# Patient Record
Sex: Female | Born: 1990 | Race: White | Hispanic: No | Marital: Single | Smoking: Never smoker
Health system: Southern US, Community
[De-identification: ages and names within clinical notes are randomized; demographics above are authoritative.]

## PROBLEM LIST (undated history)

## (undated) ENCOUNTER — Emergency Department (HOSPITAL_COMMUNITY): Discharge status: Absent without leave | Payer: Self-pay

## (undated) DIAGNOSIS — J020 Streptococcal pharyngitis: Secondary | ICD-10-CM

---
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
## 2012-07-12 ENCOUNTER — Other Ambulatory Visit: Payer: Self-pay | Admitting: Obstetrics and Gynecology

## 2012-07-12 MED ORDER — NORETHIN ACE-ETH ESTRAD-FE 1-20 MG-MCG(24) PO CHEW: 1.0000 | CHEWABLE_TABLET | Freq: Every day | ORAL | Status: DC

## 2012-07-12 NOTE — Telephone Encounter (Signed)
NICCOLE/BC REQ

## 2012-08-07 ENCOUNTER — Encounter: Payer: Self-pay | Admitting: Obstetrics and Gynecology

## 2012-10-05 ENCOUNTER — Telehealth: Payer: Self-pay

## 2012-10-05 NOTE — Telephone Encounter (Signed)
Spoke with pt rgd concerns informed pt rx named changed new rx sent to pharm in September can call pharmacy for rx pt voice understanding

## 2013-11-27 ENCOUNTER — Ambulatory Visit (INDEPENDENT_AMBULATORY_CARE_PROVIDER_SITE_OTHER): Payer: Self-pay | Admitting: Emergency Medicine

## 2013-11-27 VITALS — BP 106/66 | HR 81 | Temp 97.8°F | Resp 16 | Ht <= 58 in | Wt 101.0 lb

## 2013-11-27 DIAGNOSIS — M545 Low back pain, unspecified: Secondary | ICD-10-CM

## 2013-11-27 DIAGNOSIS — N3000 Acute cystitis without hematuria: Secondary | ICD-10-CM

## 2013-11-27 LAB — POCT UA - MICROSCOPIC ONLY
CASTS, UR, LPF, POC: NEGATIVE
Yeast, UA: NEGATIVE

## 2013-11-27 LAB — POCT URINALYSIS DIPSTICK
BILIRUBIN UA: NEGATIVE
Glucose, UA: NEGATIVE
KETONES UA: NEGATIVE
Nitrite, UA: POSITIVE
PH UA: 7
Protein, UA: 30
Spec Grav, UA: 1.02
Urobilinogen, UA: 0.2

## 2013-11-27 LAB — POCT URINE PREGNANCY: PREG TEST UR: NEGATIVE

## 2013-11-27 MED ORDER — PHENAZOPYRIDINE HCL 200 MG PO TABS: 200.0000 mg | ORAL_TABLET | Freq: Three times a day (TID) | ORAL | Status: DC | PRN

## 2013-11-27 MED ORDER — SULFAMETHOXAZOLE-TMP DS 800-160 MG PO TABS: 1.0000 | ORAL_TABLET | Freq: Two times a day (BID) | ORAL | Status: DC

## 2013-11-27 NOTE — Progress Notes (Signed)
[facility] [address], [place] [address] [phone]  Date:  11/27/2013   Name:  Sara Sanchez   DOB:  12-11-1990   MRN:  [id number]  PCP:  No primary provider on file.    Chief Complaint: Flank Pain and Emesis   History of Present Illness:  Sara Sanchez is a 23 y.o. very pleasant female patient who presents with the following:  Says she has right flank pain cramp like started on Sunday.  She thought the pain was dysmenorrhea and took OTC meds.  No relief of pain. Developed vomiting yesterday that has improved but not eating, only taking liquids.  No fever or chills.  No vaginal discharge or vomiting.  Says last menses 12/24 and so far this month's has not begun.  No dysuria, urgency or frequency, vaginal discharge or dyspareunia.  No improvement with over the counter medications or other home remedies. Denies other complaint or health concern today.   There are no active problems to display for this patient.   No past medical history on file.  No past surgical history on file.  History  Substance Use Topics  . Smoking status: Never Smoker   . Smokeless tobacco: Not on file  . Alcohol Use:     No family history on file.  No Known Allergies  Medication list has been reviewed and updated.  Current Outpatient Prescriptions on File Prior to Visit  Medication Sig Dispense Refill  . Norethin Ace-Eth Estrad-FE (MINASTRIN 24 FE) 1-20 MG-MCG(24) CHEW Chew 1 tablet by mouth daily.  28 tablet  5   No current facility-administered medications on file prior to visit.    Review of Systems:  As per HPI, otherwise negative.    Physical Examination: Filed Vitals:   11/27/13 1646  BP: 106/66  Pulse: 81  Temp: 97.8 F (36.6 C)  Resp: 16   Filed Vitals:   11/27/13 1646  Height: 4' 9.5" (1.461 m)  Weight: 101 lb (45.813 kg)   Body mass index is 21.46 kg/(m^2). Ideal Body Weight: Weight in (lb) to have BMI = 25: 117.3  GEN: WDWN, NAD,  Non-toxic, A & O x 3 HEENT: Atraumatic, Normocephalic. Neck supple. No masses, No LAD. Ears and Nose: No external deformity. CV: RRR, No M/G/R. No JVD. No thrill. No extra heart sounds. PULM: CTA B, no wheezes, crackles, rhonchi. No retractions. No resp. distress. No accessory muscle use. ABD: S, NT, ND, +BS. No rebound. No HSM. EXTR: No c/c/e NEURO Normal gait.  PSYCH: Normally interactive. Conversant. Not depressed or anxious appearing.  Calm demeanor.    Assessment and Plan: Acute cystitis Septra Pyridium  Signed,  Phillips Odor, MD   Results for orders placed in visit on 11/27/13  POCT URINE PREGNANCY      Result Value Range   Preg Test, Ur Negative    POCT URINALYSIS DIPSTICK      Result Value Range   Color, UA yellow     Clarity, UA cloudy     Glucose, UA neg     Bilirubin, UA neg     Ketones, UA neg     Spec Grav, UA 1.020     Blood, UA small     pH, UA 7.0     Protein, UA 30     Urobilinogen, UA 0.2     Nitrite, UA positive     Leukocytes, UA moderate (2+)    POCT UA - MICROSCOPIC ONLY  Result Value Range   WBC, Ur, HPF, POC 15-20     RBC, urine, microscopic 0-2     Bacteria, U Microscopic 1+     Mucus, UA small     Epithelial cells, urine per micros 0-6     Crystals, Ur, HPF, POC amorphous urate crystals     Casts, Ur, LPF, POC neg     Yeast, UA neg

## 2013-11-27 NOTE — Patient Instructions (Signed)
Urinary Tract Infection  Urinary tract infections (UTIs) can develop anywhere along your urinary tract. Your urinary tract is your body's drainage system for removing wastes and extra water. Your urinary tract includes two kidneys, two ureters, a bladder, and a urethra. Your kidneys are a pair of bean-shaped organs. Each kidney is about the size of your fist. They are located below your ribs, one on each side of your spine.  CAUSES  Infections are caused by microbes, which are microscopic organisms, including fungi, viruses, and bacteria. These organisms are so small that they can only be seen through a microscope. Bacteria are the microbes that most commonly cause UTIs.  SYMPTOMS   Symptoms of UTIs may vary by age and gender of the patient and by the location of the infection. Symptoms in young women typically include a frequent and intense urge to urinate and a painful, burning feeling in the bladder or urethra during urination. Older women and men are more likely to be tired, shaky, and weak and have muscle aches and abdominal pain. A fever may mean the infection is in your kidneys. Other symptoms of a kidney infection include pain in your back or sides below the ribs, nausea, and vomiting.  DIAGNOSIS  To diagnose a UTI, your caregiver will ask you about your symptoms. Your caregiver also will ask to provide a urine sample. The urine sample will be tested for bacteria and white blood cells. White blood cells are made by your body to help fight infection.  TREATMENT   Typically, UTIs can be treated with medication. Because most UTIs are caused by a bacterial infection, they usually can be treated with the use of antibiotics. The choice of antibiotic and length of treatment depend on your symptoms and the type of bacteria causing your infection.  HOME CARE INSTRUCTIONS   If you were prescribed antibiotics, take them exactly as your caregiver instructs you. Finish the medication even if you feel better after you  have only taken some of the medication.   Drink enough water and fluids to keep your urine clear or pale yellow.   Avoid caffeine, tea, and carbonated beverages. They tend to irritate your bladder.   Empty your bladder often. Avoid holding urine for long periods of time.   Empty your bladder before and after sexual intercourse.   After a bowel movement, women should cleanse from front to back. Use each tissue only once.  SEEK MEDICAL CARE IF:    You have back pain.   You develop a fever.   Your symptoms do not begin to resolve within 3 days.  SEEK IMMEDIATE MEDICAL CARE IF:    You have severe back pain or lower abdominal pain.   You develop chills.   You have nausea or vomiting.   You have continued burning or discomfort with urination.  MAKE SURE YOU:    Understand these instructions.   Will watch your condition.   Will get help right away if you are not doing well or get worse.  Document Released: 07/28/2005 Document Revised: 04/18/2012 Document Reviewed: 11/26/2011  [employer] Patient Information 2014 [employer].

## 2014-12-26 ENCOUNTER — Encounter (HOSPITAL_COMMUNITY): Payer: Self-pay | Admitting: *Deleted

## 2014-12-26 ENCOUNTER — Emergency Department (INDEPENDENT_AMBULATORY_CARE_PROVIDER_SITE_OTHER)
Admission: EM | Admit: 2014-12-26 | Discharge: 2014-12-26 | Disposition: A | Discharge status: Home / Self Care | Payer: Self-pay | Source: Home / Self Care | Attending: Family Medicine | Admitting: Family Medicine

## 2014-12-26 DIAGNOSIS — M545 Low back pain, unspecified: Secondary | ICD-10-CM

## 2014-12-26 DIAGNOSIS — N390 Urinary tract infection, site not specified: Secondary | ICD-10-CM

## 2014-12-26 DIAGNOSIS — R112 Nausea with vomiting, unspecified: Secondary | ICD-10-CM

## 2014-12-26 LAB — POCT URINALYSIS DIP (DEVICE)
GLUCOSE, UA: NEGATIVE mg/dL
KETONES UR: NEGATIVE mg/dL
NITRITE: NEGATIVE
Protein, ur: 100 mg/dL — AB
Specific Gravity, Urine: 1.02 (ref 1.005–1.030)
Urobilinogen, UA: 1 mg/dL (ref 0.0–1.0)
pH: 7 (ref 5.0–8.0)

## 2014-12-26 LAB — POCT PREGNANCY, URINE: PREG TEST UR: NEGATIVE

## 2014-12-26 MED ORDER — CEPHALEXIN 250 MG PO CAPS: 250.0000 mg | ORAL_CAPSULE | Freq: Four times a day (QID) | ORAL | Status: DC

## 2014-12-26 NOTE — ED Provider Notes (Signed)
CSN: [id number]     Arrival date & time 12/26/14  1503 History   First MD Initiated Contact with Patient 12/26/14 1610     Chief Complaint  Patient presents with  . Urinary Tract Infection   (Consider location/radiation/quality/duration/timing/severity/associated sxs/prior Treatment) HPI Comments: 24 year old female that had episodes of vomiting yesterday as well as left low back pain. She has had no vomiting today but has had a decrease in appetite. She has been drinking clear liquids without nausea or vomiting today. Her urine was R range last night. She denies dysuria but does have mild urgency. She attributes a slight increase in urinary frequency due to the increased amount of water she is drinking.   History reviewed. No pertinent past medical history. History reviewed. No pertinent past surgical history. History reviewed. No pertinent family history. History  Substance Use Topics  . Smoking status: Never Smoker   . Smokeless tobacco: Not on file  . Alcohol Use: Not on file   OB History    No data available     Review of Systems  Constitutional: Positive for activity change. Negative for fever and chills.  Respiratory: Negative.   Cardiovascular: Negative.   Gastrointestinal: Negative for abdominal pain.       As per history of present illness  Genitourinary: Positive for urgency. Negative for dysuria, flank pain, vaginal bleeding, vaginal discharge, difficulty urinating, vaginal pain and pelvic pain.  Musculoskeletal: Positive for back pain.  Neurological: Negative.     Allergies  Review of patient's allergies indicates no known allergies.  Home Medications   Prior to Admission medications   Medication Sig Start Date End Date Taking? Authorizing Provider  cephALEXin (KEFLEX) 250 MG capsule Take 1 capsule (250 mg total) by mouth 4 (four) times daily. 12/26/14   Hayden Rasmussenavid Irini Leet, NP   BP 109/66 mmHg  Pulse 84  Temp(Src) 97.9 F (36.6 C)  Resp 18  SpO2 96%  LMP  12/15/2014 Physical Exam  Constitutional: She is oriented to person, place, and time. She appears well-developed and well-nourished. No distress.  Neck: Normal range of motion. Neck supple.  Cardiovascular: Normal rate, regular rhythm and normal heart sounds.   Pulmonary/Chest: Effort normal and breath sounds normal. No respiratory distress. She has no wheezes. She has no rales.  Abdominal: Soft. She exhibits no distension. There is no tenderness. There is no rebound and no guarding.  Genitourinary:  Anterior palpation over the suprapubic area and pelvis reveals no tenderness or masses.  Musculoskeletal:  Patient points to the left low back as a source of back pain. This pain is exacerbated when leaning forward and to the right. She states it feels like a pulling pain. There is also muscle tenderness to palpation. There is no CVA tenderness or flank pain.  Neurological: She is alert and oriented to person, place, and time.  Skin: Skin is warm.  Nursing note and vitals reviewed.   ED Course  Procedures (including critical care time) Labs Review Labs Reviewed  POCT URINALYSIS DIP (DEVICE) - Abnormal; Notable for the following:    Bilirubin Urine SMALL (*)    Hgb urine dipstick LARGE (*)    Protein, ur 100 (*)    Leukocytes, UA SMALL (*)    All other components within normal limits  URINE CULTURE  POCT PREGNANCY, URINE   Results for orders placed or performed during the hospital encounter of 12/26/14  POCT urinalysis dip (device)  Result Value Ref Range   Glucose, UA NEGATIVE NEGATIVE mg/dL  Bilirubin Urine SMALL (A) NEGATIVE   Ketones, ur NEGATIVE NEGATIVE mg/dL   Specific Gravity, Urine 1.020 1.005 - 1.030   Hgb urine dipstick LARGE (A) NEGATIVE   pH 7.0 5.0 - 8.0   Protein, ur 100 (A) NEGATIVE mg/dL   Urobilinogen, UA 1.0 0.0 - 1.0 mg/dL   Nitrite NEGATIVE NEGATIVE   Leukocytes, UA SMALL (A) NEGATIVE  Pregnancy, urine POC  Result Value Ref Range   Preg Test, Ur NEGATIVE  NEGATIVE    Imaging Review No results found.   MDM   1. UTI (lower urinary tract infection)   2. Non-intractable vomiting with nausea, vomiting of unspecified type   3. Left-sided low back pain without sciatica     Drink plenty of fluids stay well-hydrated Add AZO as directed for symptoms Keflex as directed Urine culture pending. Discussed with patient the possibility of a renal colic however less likely than UTI. Patient was instructed that if she continues to have vomiting or pain in the flank that radiates toward the bladder to go to the emergency department. Since there been several patients seen today with gastritis and vomiting certainly possible that this may be, incidental with her UTI symptoms.   Hayden Rasmussen, NP 12/26/14 [phone]

## 2014-12-26 NOTE — ED Notes (Signed)
Pt  Reports    She  Had  A  Bladder  Infection  Last  Year   -  She  Reports   Symptoms  Of back pain    As  Well  As  Frequent  Urination         she  Reports  Some  Vomiting  As   Well   She  Reports    Her  Urine  Was  Dark   Last pm

## 2014-12-26 NOTE — Discharge Instructions (Signed)
Back Pain, Adult Low back pain is very common. About 1 in 5 people have back pain.The cause of low back pain is rarely dangerous. The pain often gets better over time.About half of people with a sudden onset of back pain feel better in just 2 weeks. About 8 in 10 people feel better by 6 weeks.  CAUSES Some common causes of back pain include:  Strain of the muscles or ligaments supporting the spine.  Wear and tear (degeneration) of the spinal discs.  Arthritis.  Direct injury to the back. DIAGNOSIS Most of the time, the direct cause of low back pain is not known.However, back pain can be treated effectively even when the exact cause of the pain is unknown.Answering your caregiver's questions about your overall health and symptoms is one of the most accurate ways to make sure the cause of your pain is not dangerous. If your caregiver needs more information, he or she may order lab work or imaging tests (X-rays or MRIs).However, even if imaging tests show changes in your back, this usually does not require surgery. HOME CARE INSTRUCTIONS For many people, back pain returns.Since low back pain is rarely dangerous, it is often a condition that people can learn to [facility] their own.   Remain active. It is stressful on the back to sit or stand in one place. Do not sit, drive, or stand in one place for more than 30 minutes at a time. Take short walks on level surfaces as soon as pain allows.Try to increase the length of time you walk each day.  Do not stay in bed.Resting more than 1 or 2 days can delay your recovery.  Do not avoid exercise or work.Your body is made to move.It is not dangerous to be active, even though your back may hurt.Your back will likely heal faster if you return to being active before your pain is gone.  Pay attention to your body when you bend and lift. Many people have less discomfortwhen lifting if they bend their knees, keep the load close to their bodies,and  avoid twisting. Often, the most comfortable positions are those that put less stress on your recovering back.  Find a comfortable position to sleep. Use a firm mattress and lie on your side with your knees slightly bent. If you lie on your back, put a pillow under your knees.  Only take over-the-counter or prescription medicines as directed by your caregiver. Over-the-counter medicines to reduce pain and inflammation are often the most helpful.Your caregiver may prescribe muscle relaxant drugs.These medicines help dull your pain so you can more quickly return to your normal activities and healthy exercise.  Put ice on the injured area.  Put ice in a plastic bag.  Place a towel between your skin and the bag.  Leave the ice on for 15-20 minutes, 03-04 times a day for the first 2 to 3 days. After that, ice and heat may be alternated to reduce pain and spasms.  Ask your caregiver about trying back exercises and gentle massage. This may be of some benefit.  Avoid feeling anxious or stressed.Stress increases muscle tension and can worsen back pain.It is important to recognize when you are anxious or stressed and learn ways to manage it.Exercise is a great option. SEEK MEDICAL CARE IF:  You have pain that is not relieved with rest or medicine.  You have pain that does not improve in 1 week.  You have new symptoms.  You are generally not feeling well. SEEK  IMMEDIATE MEDICAL CARE IF:   You have pain that radiates from your back into your legs.  You develop new bowel or bladder control problems.  You have unusual weakness or numbness in your arms or legs.  You develop nausea or vomiting.  You develop abdominal pain.  You feel faint. Document Released: 10/18/2005 Document Revised: 04/18/2012 Document Reviewed: 02/19/2014 [facility] Patient Information 2015 [place], [place]. This information is not intended to replace advice given to you by your health care provider. Make sure you  discuss any questions you have with your health care provider.  Nausea and Vomiting Nausea means you feel sick to your stomach. Throwing up (vomiting) is a reflex where stomach contents come out of your mouth. HOME CARE   Take medicine as told by your doctor.  Do not force yourself to eat. However, you do need to drink fluids.  If you feel like eating, eat a normal diet as told by your doctor.  Eat rice, wheat, potatoes, bread, lean meats, yogurt, fruits, and vegetables.  Avoid high-fat foods.  Drink enough fluids to keep your pee (urine) clear or pale yellow.  Ask your doctor how to replace body fluid losses (rehydrate). Signs of body fluid loss (dehydration) include:  Feeling very thirsty.  Dry lips and mouth.  Feeling dizzy.  Dark pee.  Peeing less than normal.  Feeling confused.  Fast breathing or heart rate. GET HELP RIGHT AWAY IF:   You have blood in your throw up.  You have black or bloody poop (stool).  You have a bad headache or stiff neck.  You feel confused.  You have bad belly (abdominal) pain.  You have chest pain or trouble breathing.  You do not pee at least once every 8 hours.  You have cold, clammy skin.  You keep throwing up after 24 to 48 hours.  You have a fever. MAKE SURE YOU:   Understand these instructions.  Will watch your condition.  Will get help right away if you are not doing well or get worse. Document Released: 04/05/2008 Document Revised: 01/10/2012 Document Reviewed: 03/19/2011 [facility] Patient Information 2015 [place], [place]. This information is not intended to replace advice given to you by your health care provider. Make sure you discuss any questions you have with your health care provider.  Urinary Tract Infection Urinary tract infections (UTIs) can develop anywhere along your urinary tract. Your urinary tract is your body's drainage system for removing wastes and extra water. Your urinary tract includes two  kidneys, two ureters, a bladder, and a urethra. Your kidneys are a pair of bean-shaped organs. Each kidney is about the size of your fist. They are located below your ribs, one on each side of your spine. CAUSES Infections are caused by microbes, which are microscopic organisms, including fungi, viruses, and bacteria. These organisms are so small that they can only be seen through a microscope. Bacteria are the microbes that most commonly cause UTIs. SYMPTOMS  Symptoms of UTIs may vary by age and gender of the patient and by the location of the infection. Symptoms in young women typically include a frequent and intense urge to urinate and a painful, burning feeling in the bladder or urethra during urination. Older women and men are more likely to be tired, shaky, and weak and have muscle aches and abdominal pain. A fever may mean the infection is in your kidneys. Other symptoms of a kidney infection include pain in your back or sides below the ribs, nausea, and vomiting.  DIAGNOSIS To diagnose a UTI, your caregiver will ask you about your symptoms. Your caregiver also will ask to provide a urine sample. The urine sample will be tested for bacteria and white blood cells. White blood cells are made by your body to help fight infection. TREATMENT  Typically, UTIs can be treated with medication. Because most UTIs are caused by a bacterial infection, they usually can be treated with the use of antibiotics. The choice of antibiotic and length of treatment depend on your symptoms and the type of bacteria causing your infection. HOME CARE INSTRUCTIONS  If you were prescribed antibiotics, take them exactly as your caregiver instructs you. Finish the medication even if you feel better after you have only taken some of the medication.  Drink enough water and fluids to keep your urine clear or pale yellow.  Avoid caffeine, tea, and carbonated beverages. They tend to irritate your bladder.  Empty your bladder  often. Avoid holding urine for long periods of time.  Empty your bladder before and after sexual intercourse.  After a bowel movement, women should cleanse from front to back. Use each tissue only once. SEEK MEDICAL CARE IF:   You have back pain.  You develop a fever.  Your symptoms do not begin to resolve within 3 days. SEEK IMMEDIATE MEDICAL CARE IF:   You have severe back pain or lower abdominal pain.  You develop chills.  You have nausea or vomiting.  You have continued burning or discomfort with urination. MAKE SURE YOU:   Understand these instructions.  Will watch your condition.  Will get help right away if you are not doing well or get worse. Document Released: 07/28/2005 Document Revised: 04/18/2012 Document Reviewed: 11/26/2011 [facility] Patient Information 2015 [place]. This information is not intended to replace advice given to you by your health care provider. Make sure you discuss any questions you have with your health care provider.

## 2014-12-28 ENCOUNTER — Emergency Department (INDEPENDENT_AMBULATORY_CARE_PROVIDER_SITE_OTHER): Payer: Self-pay

## 2014-12-28 ENCOUNTER — Telehealth (HOSPITAL_COMMUNITY): Payer: Self-pay | Admitting: *Deleted

## 2014-12-28 ENCOUNTER — Emergency Department (INDEPENDENT_AMBULATORY_CARE_PROVIDER_SITE_OTHER)
Admission: EM | Admit: 2014-12-28 | Discharge: 2014-12-28 | Disposition: A | Discharge status: Home / Self Care | Payer: Self-pay | Source: Home / Self Care | Attending: Emergency Medicine | Admitting: Emergency Medicine

## 2014-12-28 ENCOUNTER — Encounter (HOSPITAL_COMMUNITY): Payer: Self-pay | Admitting: *Deleted

## 2014-12-28 DIAGNOSIS — R109 Unspecified abdominal pain: Secondary | ICD-10-CM

## 2014-12-28 DIAGNOSIS — N390 Urinary tract infection, site not specified: Secondary | ICD-10-CM

## 2014-12-28 LAB — CBC WITH DIFFERENTIAL/PLATELET
BASOS ABS: 0 10*3/uL (ref 0.0–0.1)
Basophils Relative: 0 % (ref 0–1)
Eosinophils Absolute: 0.1 10*3/uL (ref 0.0–0.7)
Eosinophils Relative: 1 % (ref 0–5)
HCT: 41.4 % (ref 36.0–46.0)
HEMOGLOBIN: 13.6 g/dL (ref 12.0–15.0)
LYMPHS ABS: 1.1 10*3/uL (ref 0.7–4.0)
Lymphocytes Relative: 9 % — ABNORMAL LOW (ref 12–46)
MCH: 29.8 pg (ref 26.0–34.0)
MCHC: 32.9 g/dL (ref 30.0–36.0)
MCV: 90.6 fL (ref 78.0–100.0)
Monocytes Absolute: 0.5 10*3/uL (ref 0.1–1.0)
Monocytes Relative: 4 % (ref 3–12)
Neutro Abs: 10.2 10*3/uL — ABNORMAL HIGH (ref 1.7–7.7)
Neutrophils Relative %: 86 % — ABNORMAL HIGH (ref 43–77)
Platelets: 332 10*3/uL (ref 150–400)
RBC: 4.57 MIL/uL (ref 3.87–5.11)
RDW: 13 % (ref 11.5–15.5)
WBC: 12 10*3/uL — ABNORMAL HIGH (ref 4.0–10.5)

## 2014-12-28 LAB — POCT I-STAT, CHEM 8
BUN: 14 mg/dL (ref 6–23)
CHLORIDE: 103 mmol/L (ref 96–112)
Calcium, Ion: 1.13 mmol/L (ref 1.12–1.23)
Creatinine, Ser: 0.8 mg/dL (ref 0.50–1.10)
GLUCOSE: 88 mg/dL (ref 70–99)
HEMATOCRIT: 45 % (ref 36.0–46.0)
HEMOGLOBIN: 15.3 g/dL — AB (ref 12.0–15.0)
Potassium: 3.8 mmol/L (ref 3.5–5.1)
Sodium: 141 mmol/L (ref 135–145)
TCO2: 22 mmol/L (ref 0–100)

## 2014-12-28 LAB — POCT PREGNANCY, URINE: Preg Test, Ur: NEGATIVE

## 2014-12-28 LAB — POCT URINALYSIS DIP (DEVICE)
Glucose, UA: NEGATIVE mg/dL
NITRITE: NEGATIVE
PROTEIN: 100 mg/dL — AB
UROBILINOGEN UA: 1 mg/dL (ref 0.0–1.0)
pH: 6 (ref 5.0–8.0)

## 2014-12-28 LAB — URINE CULTURE
Colony Count: >=100000
Special Requests: NORMAL

## 2014-12-28 MED ORDER — LIDOCAINE HCL (PF) 1 % IJ SOLN
INTRAMUSCULAR | Status: AC
  Filled 2014-12-28: qty 5

## 2014-12-28 MED ORDER — HYDROCODONE-ACETAMINOPHEN 5-325 MG PO TABS: ORAL_TABLET | ORAL | Status: DC

## 2014-12-28 MED ORDER — CEFTRIAXONE SODIUM 1 G IJ SOLR
INTRAMUSCULAR | Status: AC
  Filled 2014-12-28: qty 10

## 2014-12-28 MED ORDER — ONDANSETRON 4 MG PO TBDP
8.0000 mg | ORAL_TABLET | Freq: Once | ORAL | Status: AC
  Administered 2014-12-28: 8 mg via ORAL

## 2014-12-28 MED ORDER — KETOROLAC TROMETHAMINE 60 MG/2ML IM SOLN
30.0000 mg | Freq: Once | INTRAMUSCULAR | Status: AC
  Administered 2014-12-28: 30 mg via INTRAMUSCULAR

## 2014-12-28 MED ORDER — CEFTRIAXONE SODIUM 250 MG IJ SOLR
INTRAMUSCULAR | Status: AC
  Filled 2014-12-28: qty 250

## 2014-12-28 MED ORDER — CEFTRIAXONE SODIUM 1 G IJ SOLR
1.0000 g | Freq: Once | INTRAMUSCULAR | Status: AC
  Administered 2014-12-28: 1 g via INTRAMUSCULAR

## 2014-12-28 MED ORDER — ONDANSETRON 4 MG PO TBDP
ORAL_TABLET | ORAL | Status: AC
  Filled 2014-12-28: qty 2

## 2014-12-28 MED ORDER — CIPROFLOXACIN HCL 500 MG PO TABS: 500.0000 mg | ORAL_TABLET | Freq: Two times a day (BID) | ORAL | Status: DC

## 2014-12-28 MED ORDER — KETOROLAC TROMETHAMINE 30 MG/ML IJ SOLN
INTRAMUSCULAR | Status: AC
  Filled 2014-12-28: qty 1

## 2014-12-28 NOTE — Discharge Instructions (Signed)
Rest and get extra fluids.  Return here in 2 days.  Strain urine.  If getting worse, go to emergency room.

## 2014-12-28 NOTE — ED Notes (Signed)
Pt. called back and said she was going to come back now for the urine strainer.  Left it at the front desk. 12/28/2014

## 2014-12-28 NOTE — ED Notes (Signed)
C/o L side abdominal pain onset 2/25. Seen here and given antibiotics.  Pain went away and came back this AM.  She had a UTI last year and thought it was the same thing.  C/o nausea and chills and fever.

## 2014-12-28 NOTE — ED Notes (Signed)
Pt. left urine strainer. EMT called pt. and left a message to call.  I called again at [phone] and left another message telling her pick it up tomorrow.

## 2014-12-28 NOTE — ED Provider Notes (Signed)
Chief Complaint   Abdominal Pain   History of Present Illness   Colin Bentonaige L Pyka is a 24 year old female who returns for follow-up after being seen 2 days ago for similar symptoms. Her chief complaint tonight is left flank pain which when she was complaining of when she was here last. She was diagnosed with urinary tract infection, culture obtained, and she was begun on cephalexin. Her cultures growing out multiple species. Today the pain seems to have migrated around from the back. The front. The pain is rated 6-7/10 to worse and now is a 5-6 and is described as cramping. Is worse with walking and better with a hot shower. She's felt nauseated and vomited a couple times. She denies any fever or chills. She has had urinary tract infections in the past. No diarrhea, constipation, or blood in the stool. She denies any dysuria, frequency, or urgency. No visible blood in the urine. No GYN complaints. She denies ever being sexually active.  Review of Systems   Other than as noted above, the patient denies any of the following symptoms: Constitutional:  No fever, chills, weight loss or anorexia. Abdomen:  No nausea, vomiting, hematememesis, melena, diarrhea, or hematochezia. GU:  No dysuria, frequency, urgency, or hematuria. Gyn:  No vaginal discharge, itching, abnormal bleeding, dyspareunia, or pelvic pain.  PMFSH   Past medical history, family history, social history, meds, and allergies were reviewed.   Physical Exam     Vital signs:  BP 132/82 mmHg  Pulse 87  Temp(Src) 97.7 F (36.5 C) (Oral)  Resp 16  SpO2 100%  LMP 12/15/2014 (Exact Date) Gen:  Alert, oriented, in no distress. Lungs:  Breath sounds clear and equal bilaterally.  No wheezes, rales or rhonchi. Heart:  Regular rhythm.  No gallops or murmers.   Abdomen:  Soft, flat, nondistended. No organomegaly or mass. Bowel sounds are normally active. There is moderate pain to palpation in the left flank area. No guarding or rebound.  No pain to palpation in left upper or lower abdomen or the entire right abdomen. Skin:  Clear, warm and dry.  No rash.  Labs   Results for orders placed or performed during the hospital encounter of 12/28/14  CBC with Differential/Platelet  Result Value Ref Range   WBC 12.0 (H) 4.0 - 10.5 K/uL   RBC 4.57 3.87 - 5.11 MIL/uL   Hemoglobin 13.6 12.0 - 15.0 g/dL   HCT 16.141.4 09.636.0 - 04.546.0 %   MCV 90.6 78.0 - 100.0 fL   MCH 29.8 26.0 - 34.0 pg   MCHC 32.9 30.0 - 36.0 g/dL   RDW 40.913.0 81.111.5 - 91.415.5 %   Platelets 332 150 - 400 K/uL   Neutrophils Relative % 86 (H) 43 - 77 %   Neutro Abs 10.2 (H) 1.7 - 7.7 K/uL   Lymphocytes Relative 9 (L) 12 - 46 %   Lymphs Abs 1.1 0.7 - 4.0 K/uL   Monocytes Relative 4 3 - 12 %   Monocytes Absolute 0.5 0.1 - 1.0 K/uL   Eosinophils Relative 1 0 - 5 %   Eosinophils Absolute 0.1 0.0 - 0.7 K/uL   Basophils Relative 0 0 - 1 %   Basophils Absolute 0.0 0.0 - 0.1 K/uL  POCT urinalysis dip (device)  Result Value Ref Range   Glucose, UA NEGATIVE NEGATIVE mg/dL   Bilirubin Urine SMALL (A) NEGATIVE   Ketones, ur TRACE (A) NEGATIVE mg/dL   Specific Gravity, Urine >=1.030 1.005 - 1.030   Hgb urine  dipstick LARGE (A) NEGATIVE   pH 6.0 5.0 - 8.0   Protein, ur 100 (A) NEGATIVE mg/dL   Urobilinogen, UA 1.0 0.0 - 1.0 mg/dL   Nitrite NEGATIVE NEGATIVE   Leukocytes, UA TRACE (A) NEGATIVE  I-STAT, chem 8  Result Value Ref Range   Sodium 141 135 - 145 mmol/L   Potassium 3.8 3.5 - 5.1 mmol/L   Chloride 103 96 - 112 mmol/L   BUN 14 6 - 23 mg/dL   Creatinine, Ser 1.61 0.50 - 1.10 mg/dL   Glucose, Bld 88 70 - 99 mg/dL   Calcium, Ion 0.96 0.45 - 1.23 mmol/L   TCO2 22 0 - 100 mmol/L   Hemoglobin 15.3 (H) 12.0 - 15.0 g/dL   HCT 40.9 81.1 - 91.4 %  Pregnancy, urine POC  Result Value Ref Range   Preg Test, Ur NEGATIVE NEGATIVE    Radiology   Dg Abd 1 View  12/28/2014   CLINICAL DATA:  Left side pain.  Left flank pain.  EXAM: ABDOMEN - 1 VIEW  COMPARISON:  None.  FINDINGS:  Punctate density projects over the lower pole of the left kidney, also projecting over the fecal stream. This could represent small left renal stone or material within the fecal stream. No suspicious calcification over the expected course of the ureters or bladder.  Large stool burden throughout the colon. Nonobstructive bowel gas pattern. No organomegaly. No acute bony abnormality.  IMPRESSION: Questionable left lower pole nephrolithiasis.  No acute findings.   Electronically Signed   By: Charlett Nose M.D.   On: 12/28/2014 18:16    Course in [facility]   The following medications were given:  Medications  ondansetron (ZOFRAN-ODT) disintegrating tablet 8 mg (8 mg Oral Given 12/28/14 1844)  ketorolac (TORADOL) injection 30 mg (30 mg Intramuscular Given 12/28/14 1844)  cefTRIAXone (ROCEPHIN) injection 1 g (1 g Intramuscular Given 12/28/14 1955)    She felt a lot better after getting the Toradol shot. She did not vomit at all while here at the urgent care.  Assessment   The primary encounter diagnosis was UTI (lower urinary tract infection). A diagnosis of Left flank pain was also pertinent to this visit.  Differential diagnosis includes urinary tract infection, stones, diverticulitis, GYN causes, or constipation. He does not appear toxic and she's not febrile. Her white count is up a little bit but she's not tachycardic. I think she is still a good candidate for outpatient management. We'll change her antibiotics to Cipro and given IM Rocephin tonight. She is to strain her urine. Return again in 24 hours for a scheduled recheck. If she still not getting better she'll need to go to the emergency room.  Plan     1.  Meds:  The following meds were prescribed:   Discharge Medication List as of 12/28/2014  7:40 PM    START taking these medications   Details  ciprofloxacin (CIPRO) 500 MG tablet Take 1 tablet (500 mg total) by mouth every 12 (twelve) hours., Starting 12/28/2014, Until  Discontinued, Normal    HYDROcodone-acetaminophen (NORCO/VICODIN) 5-325 MG per tablet 1 to 2 tabs every 4 to 6 hours as needed for pain., Print        2.  Patient Education/Counseling:  The patient was given appropriate handouts, self care instructions, and instructed in symptomatic relief.  Told to get extra rest and fluids and to strain all urine.  3.  Follow up:  The patient was told to follow up here if no  better in 3 to 4 days, or sooner if becoming worse in any way, and given some red flag symptoms such as worsening pain, fever, or vomiting which would prompt immediate return.     Reuben Likes, MD 12/28/14 2030

## 2014-12-30 ENCOUNTER — Encounter (HOSPITAL_COMMUNITY): Payer: Self-pay | Admitting: Emergency Medicine

## 2014-12-30 ENCOUNTER — Emergency Department (INDEPENDENT_AMBULATORY_CARE_PROVIDER_SITE_OTHER)
Admission: EM | Admit: 2014-12-30 | Discharge: 2014-12-30 | Disposition: A | Discharge status: Home / Self Care | Payer: Self-pay | Source: Home / Self Care | Attending: Emergency Medicine | Admitting: Emergency Medicine

## 2014-12-30 DIAGNOSIS — B3741 Candidal cystitis and urethritis: Secondary | ICD-10-CM

## 2014-12-30 DIAGNOSIS — R319 Hematuria, unspecified: Secondary | ICD-10-CM

## 2014-12-30 DIAGNOSIS — R109 Unspecified abdominal pain: Secondary | ICD-10-CM

## 2014-12-30 LAB — POCT URINALYSIS DIP (DEVICE)
Bilirubin Urine: NEGATIVE
Glucose, UA: NEGATIVE mg/dL
HGB URINE DIPSTICK: NEGATIVE
Ketones, ur: NEGATIVE mg/dL
Nitrite: NEGATIVE
PH: 7 (ref 5.0–8.0)
PROTEIN: 30 mg/dL — AB
Specific Gravity, Urine: >=1.03 (ref 1.005–1.030)
Urobilinogen, UA: 1 mg/dL (ref 0.0–1.0)

## 2014-12-30 LAB — URINE CULTURE
Colony Count: 50000
Special Requests: NORMAL

## 2014-12-30 MED ORDER — FLUCONAZOLE 150 MG PO TABS: 150.0000 mg | ORAL_TABLET | Freq: Once | ORAL | Status: DC

## 2014-12-30 NOTE — ED Notes (Signed)
Pt states that she was here 2 days ago diagnosed with UTI and is here for follow up.

## 2014-12-30 NOTE — Discharge Instructions (Signed)
Finish up cipro.  Take Diflucan for yeast.  Get extra fluids.  Follow up with Dr. Annabell HowellsWrenn.

## 2014-12-30 NOTE — ED Provider Notes (Signed)
Chief Complaint   Follow-up   History of Present Illness   Colin Bentonaige L Hamstra is a 24 year old female who returns today for scheduled follow-up on some left flank pain and hematuria. She was seen here 4 days ago initially for the same symptoms. At that time her urine culture grew out multiple species. When she was seen here 2 days ago, her urine cultures still showed large hemoglobin otherwise was negative. She did not have a fever, but her white count was mildly elevated at 12,000. She was switched from cephalexin to Cipro and given IM Rocephin. Today she states she's quite a bit better. She still having mild flank pain but is only a 2. She denies fever, chills, nausea, or vomiting. She denies urinary frequency, urgency, dysuria, or visible hematuria. She's had a little bit of vaginal discharge but no itching. The patient states she's never been sexually active. Her urine culture is growing out 50,000 colonies of yeast, but this is after a course of antibiotics and may represent yeast overgrowth secondary to antibiotic usage.  Review of Systems   Other than as noted above, the patient denies any of the following symptoms: Constitutional:  No fever, chills, weight loss or anorexia. Abdomen:  No nausea, vomiting, hematememesis, melena, diarrhea, or hematochezia. GU:  No dysuria, frequency, urgency, or hematuria. Gyn:  No vaginal discharge, itching, abnormal bleeding, dyspareunia, or pelvic pain.  PMFSH   Past medical history, family history, social history, meds, and allergies were reviewed.   Physical Exam     Vital signs:  BP 104/64 mmHg  Pulse 78  Temp(Src) 98 F (36.7 C) (Oral)  Resp 18  SpO2 96%  LMP 12/15/2014 (Exact Date) Gen:  Alert, oriented, in no distress. Lungs:  Breath sounds clear and equal bilaterally.  No wheezes, rales or rhonchi. Heart:  Regular rhythm.  No gallops or murmers.   Abdomen:  She has minimal left flank tenderness to palpation without guarding or rebound.  No tenderness to palpation in any other quadrant of the abdomen. Bowel sounds were active. No organomegaly or mass. Skin:  Clear, warm and dry.  No rash.  Labs   Results for orders placed or performed during the hospital encounter of 12/30/14  POCT urinalysis dip (device)  Result Value Ref Range   Glucose, UA NEGATIVE NEGATIVE mg/dL   Bilirubin Urine NEGATIVE NEGATIVE   Ketones, ur NEGATIVE NEGATIVE mg/dL   Specific Gravity, Urine >=1.030 1.005 - 1.030   Hgb urine dipstick NEGATIVE NEGATIVE   pH 7.0 5.0 - 8.0   Protein, ur 30 (A) NEGATIVE mg/dL   Urobilinogen, UA 1.0 0.0 - 1.0 mg/dL   Nitrite NEGATIVE NEGATIVE   Leukocytes, UA TRACE (A) NEGATIVE   Assessment   The primary encounter diagnosis was Left flank pain. Diagnoses of Hematuria and Candida cystitis were also pertinent to this visit.  Cause of left flank pain could be UTI versus kidney stone. I call the urologist on-call, Dr. Annabell HowellsWrenn who recommended that she be seen in their office for abdominal CT scan. She is to continue the Cipro in the meantime. I also discussed with him the Candida in the urine and he states that this could be due to yeast overgrowth secondary to antibiotics and recommended a single dose of Diflucan.  Plan     1.  Meds:  The following meds were prescribed:   Discharge Medication List as of 12/30/2014  8:27 PM    START taking these medications   Details  fluconazole (DIFLUCAN) 150  MG tablet Take 1 tablet (150 mg total) by mouth once., Starting 12/30/2014, Normal        2.  Patient Education/Counseling:  The patient was given appropriate handouts, self care instructions, and instructed in symptomatic relief.  Continue on with the Cipro.  3.  Follow up:  The patient was told to follow up here if no better in 3 to 4 days, or sooner if becoming worse in any way, and given some red flag symptoms such as worsening pain, fever, vomiting, or evidence of GI bleeding which would prompt immediate return.  Follow-up  with Dr. Annabell Howells later on this week.    Reuben Likes, MD 12/30/14 2049

## 2015-01-01 LAB — URINE CULTURE
Colony Count: NO GROWTH
Culture: NO GROWTH

## 2015-01-01 NOTE — ED Notes (Signed)
Urine culture: 50,000 colonies yeast.  Pt. treated with Diflucan and repeat urine culture had no growth per Dr. Lorenz CoasterKeller. Sara Sanchez, Steele Stracener M 01/01/2015

## 2015-09-25 IMAGING — DX DG ABDOMEN 1V
1 series · 1 of 1 positions shown · non-contrast
Comparison: None.

CLINICAL DATA: Left side pain.  Left flank pain.

EXAM:
ABDOMEN - 1 VIEW

[abdomen supine]
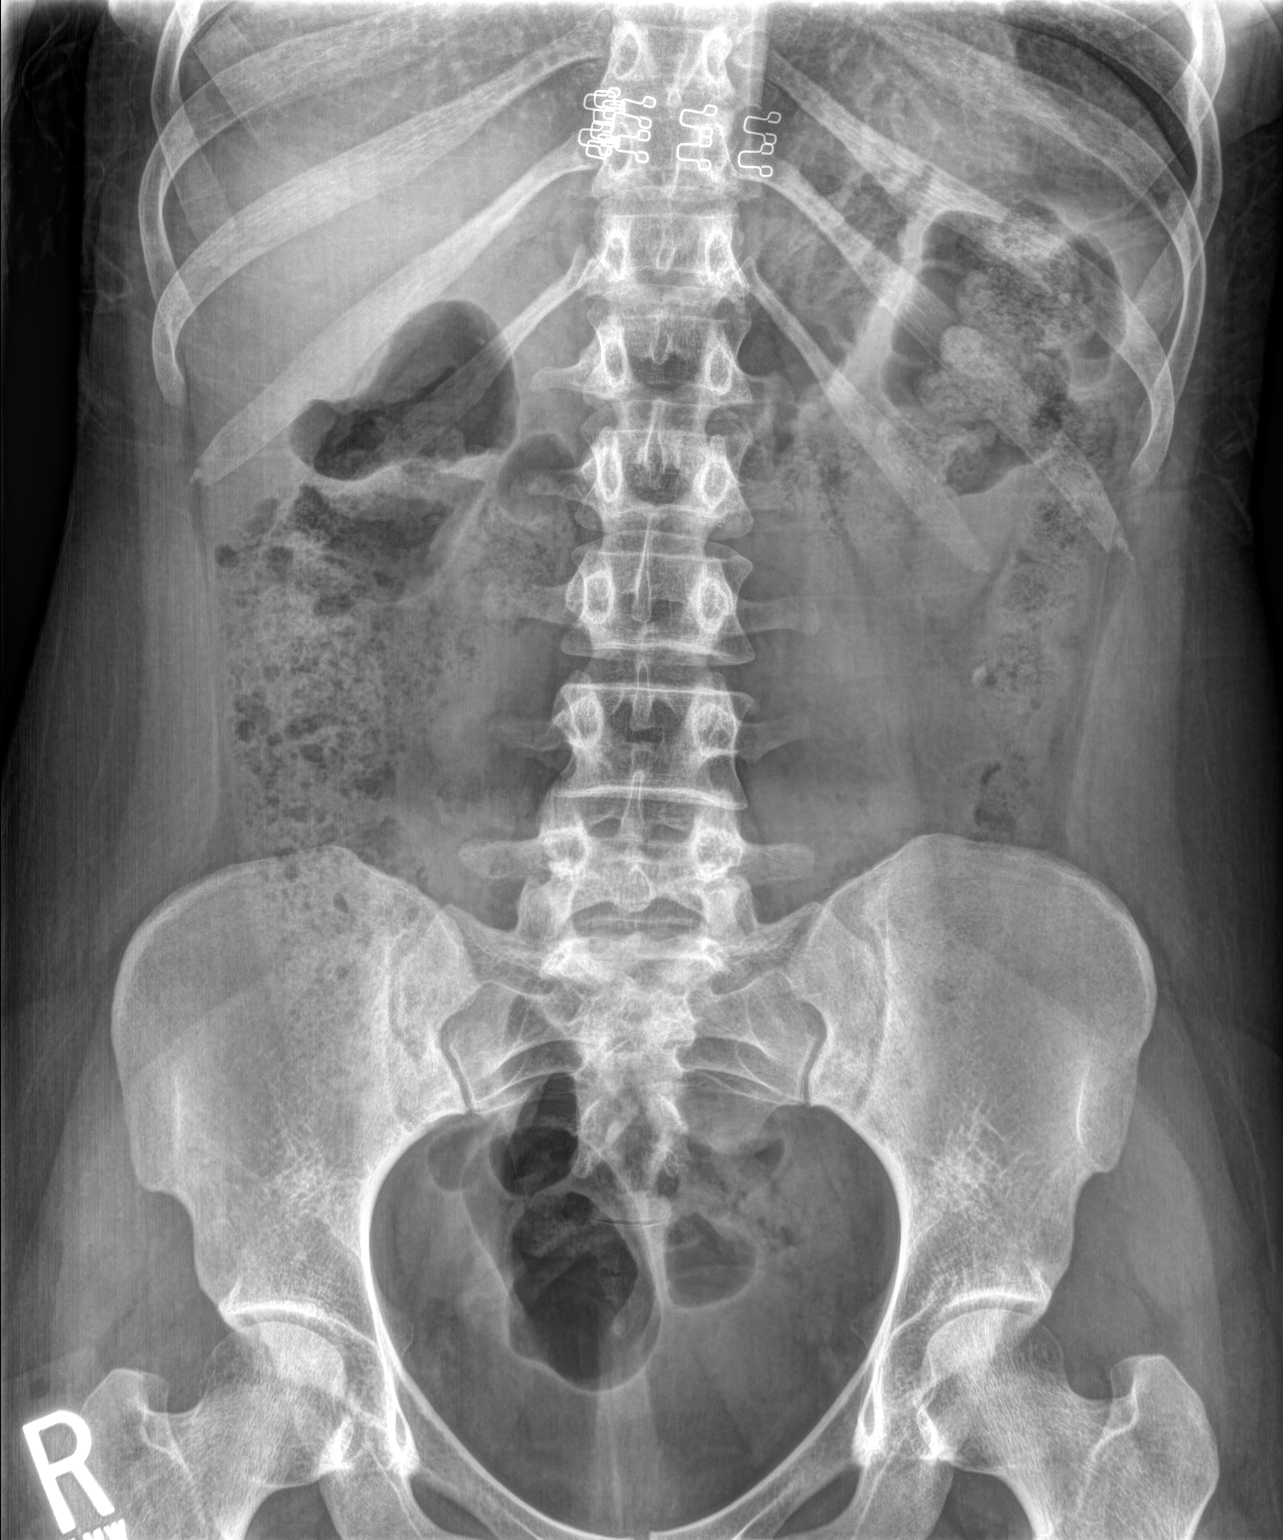

[1 of 1 positions shown; findings below may reference images not displayed]

FINDINGS: Punctate density projects over the lower pole of the left kidney,
also projecting over the fecal stream. This could represent small
left renal stone or material within the fecal stream. No suspicious
calcification over the expected course of the ureters or bladder.

Large stool burden throughout the colon. Nonobstructive bowel gas
pattern. No organomegaly. No acute bony abnormality.
IMPRESSION: Questionable left lower pole nephrolithiasis.

No acute findings.

## 2016-03-31 ENCOUNTER — Ambulatory Visit (HOSPITAL_COMMUNITY)
Admission: EM | Admit: 2016-03-31 | Discharge: 2016-03-31 | Disposition: A | Discharge status: Home / Self Care | Payer: Self-pay | Attending: Emergency Medicine | Admitting: Emergency Medicine

## 2016-03-31 ENCOUNTER — Encounter (HOSPITAL_COMMUNITY): Payer: Self-pay | Admitting: Emergency Medicine

## 2016-03-31 DIAGNOSIS — J020 Streptococcal pharyngitis: Secondary | ICD-10-CM

## 2016-03-31 HISTORY — DX: Streptococcal pharyngitis: J02.0

## 2016-03-31 LAB — POCT RAPID STREP A: Streptococcus, Group A Screen (Direct): POSITIVE — AB

## 2016-03-31 MED ORDER — ACETAMINOPHEN 325 MG PO TABS
ORAL_TABLET | ORAL | Status: AC
  Filled 2016-03-31: qty 2

## 2016-03-31 MED ORDER — ACETAMINOPHEN 325 MG PO TABS
650.0000 mg | ORAL_TABLET | Freq: Once | ORAL | Status: AC
  Administered 2016-03-31: 650 mg via ORAL

## 2016-03-31 MED ORDER — AMOXICILLIN 500 MG PO CAPS
500.0000 mg | ORAL_CAPSULE | Freq: Three times a day (TID) | ORAL | Status: AC
Start: 2016-03-31 — End: ?

## 2016-03-31 NOTE — ED Notes (Signed)
PT reports she started with a sore throat yesterday. Today PT is febrile with a white patchy throat. PT had strep several times as a child.

## 2016-03-31 NOTE — ED Provider Notes (Signed)
CSN: [id number]     Arrival date & time 03/31/16  1316 History   First MD Initiated Contact with Patient 03/31/16 1328     Chief Complaint  Patient presents with  . Sore Throat   (Consider location/radiation/quality/duration/timing/severity/associated sxs/prior Treatment) HPI She is a 25 year old woman here for evaluation of sore throat. Her symptoms started yesterday with sore throat, worse with swallowing, and fevers. She denies any nasal congestion or rhinorrhea. She does report a mild cough. No nausea or vomiting. She did have a brief episode of dizziness this morning when she first got up. This resolved spontaneously. She is able to tolerate food and liquids well. She has not tried any medications. No known sick contacts. No exposure to children.  Past Medical History  Diagnosis Date  . Strep throat    Past Surgical History  Procedure Laterality Date  . Wisdom tooth extraction  2012   No family history on file. Social History  Substance Use Topics  . Smoking status: Never Smoker   . Smokeless tobacco: None  . Alcohol Use: Yes     Comment: occasional   OB History    No data available     Review of Systems As in history of present illness Allergies  Review of patient's allergies indicates no known allergies.  Home Medications   Prior to Admission medications   Medication Sig Start Date End Date Taking? Authorizing Provider  amoxicillin (AMOXIL) 500 MG capsule Take 1 capsule (500 mg total) by mouth 3 (three) times daily. 03/31/16   Charm RingsErin J Milferd Ansell, MD   Meds Ordered and Administered this Visit   Medications  acetaminophen (TYLENOL) tablet 650 mg (650 mg Oral Given 03/31/16 1348)    BP 112/83 mmHg  Pulse 113  Temp(Src) 101.1 F (38.4 C) (Oral)  Resp 18  Ht 4\' 11"  (1.499 m)  Wt 109 lb (49.442 kg)  BMI 22.00 kg/m2  SpO2 100%  LMP 03/03/2016 No data found.   Physical Exam  Constitutional: She is oriented to person, place, and time. She appears well-developed and  well-nourished. No distress.  HENT:  Mouth/Throat: Oropharyngeal exudate present.  Tonsils are erythematous with punctate white exudates.  Neck: Neck supple.  Cardiovascular: Normal rate, regular rhythm and normal heart sounds.   No murmur heard. Pulmonary/Chest: Effort normal and breath sounds normal. No respiratory distress. She has no wheezes. She has no rales.  Lymphadenopathy:    She has cervical adenopathy.  Neurological: She is alert and oriented to person, place, and time.    ED Course  Procedures (including critical care time)  Labs Review Labs Reviewed  POCT RAPID STREP A - Abnormal; Notable for the following:    Streptococcus, Group A Screen (Direct) POSITIVE (*)    All other components within normal limits    Imaging Review No results found.    MDM   1. Strep pharyngitis    Treat with amoxicillin for 10 days. Symptomatic treatment discussed as well. Follow-up as needed.    Charm RingsErin J Zackarey Holleman, MD 03/31/16 [phone]

## 2016-03-31 NOTE — Discharge Instructions (Signed)
Strep Throat °Strep throat is an infection of the throat. It is caused by germs. Strep throat spreads from person to person because of coughing, sneezing, or close contact. °HOME CARE °Medicines  °· Take over-the-counter and prescription medicines only as told by your doctor. °· Take your antibiotic medicine as told by your doctor. Do not stop taking the medicine even if you feel better. °· Have family members who also have a sore throat or fever go to a doctor. °Eating and Drinking  °· Do not share food, drinking cups, or personal items. °· Try eating soft foods until your sore throat feels better. °· Drink enough fluid to keep your pee (urine) clear or pale yellow. °General Instructions °· Rinse your mouth (gargle) with a salt-water mixture 3-4 times per day or as needed. To make a salt-water mixture, stir ½-1 tsp of salt into 1 cup of warm water. °· Make sure that all people in your house wash their hands well. °· Rest. °· Stay home from school or work until you have been taking antibiotics for 24 hours. °· Keep all follow-up visits as told by your doctor. This is important. °GET HELP IF: °· Your neck keeps getting bigger. °· You get a rash, cough, or earache. °· You cough up thick liquid that is green, yellow-brown, or bloody. °· You have pain that does not get better with medicine. °· Your problems get worse instead of getting better. °· You have a fever. °GET HELP RIGHT AWAY IF: °· You throw up (vomit). °· You get a very bad headache. °· You neck hurts or it feels stiff. °· You have chest pain or you are short of breath. °· You have drooling, very bad throat pain, or changes in your voice. °· Your neck is swollen or the skin gets red and tender. °· Your mouth is dry or you are peeing less than normal. °· You keep feeling more tired or it is hard to wake up. °· Your joints are red or they hurt. °  °This information is not intended to replace advice given to you by your health care provider. Make sure you  discuss any questions you have with your health care provider. °  °Document Released: 04/05/2008 Document Revised: 07/09/2015 Document Reviewed: 02/10/2015 °Elsevier Interactive Patient Education ©2016 [employer] ° °

## 2016-07-18 ENCOUNTER — Ambulatory Visit (HOSPITAL_COMMUNITY)
Admission: EM | Admit: 2016-07-18 | Discharge: 2016-07-18 | Disposition: A | Discharge status: Home / Self Care | Payer: Self-pay | Attending: Family Medicine | Admitting: Family Medicine

## 2016-07-18 DIAGNOSIS — N3090 Cystitis, unspecified without hematuria: Secondary | ICD-10-CM

## 2016-07-18 LAB — POCT URINALYSIS DIP (DEVICE)
Bilirubin Urine: NEGATIVE
Glucose, UA: NEGATIVE mg/dL
Ketones, ur: 40 mg/dL — AB
NITRITE: NEGATIVE
PH: 8.5 — AB (ref 5.0–8.0)
PROTEIN: 100 mg/dL — AB
Specific Gravity, Urine: 1.015 (ref 1.005–1.030)
UROBILINOGEN UA: 1 mg/dL (ref 0.0–1.0)

## 2016-07-18 LAB — POCT PREGNANCY, URINE: Preg Test, Ur: NEGATIVE

## 2016-07-18 MED ORDER — ONDANSETRON 4 MG PO TBDP
ORAL_TABLET | ORAL | Status: AC
  Filled 2016-07-18: qty 1

## 2016-07-18 MED ORDER — CEPHALEXIN 500 MG PO CAPS: 500.0000 mg | ORAL_CAPSULE | Freq: Four times a day (QID) | ORAL | 0 refills | Status: AC

## 2016-07-18 MED ORDER — ONDANSETRON 4 MG PO TBDP
4.0000 mg | ORAL_TABLET | Freq: Once | ORAL | Status: AC
  Administered 2016-07-18: 4 mg via ORAL

## 2016-07-18 NOTE — ED Provider Notes (Signed)
[facility]    CSN: [id number] Arrival date & time: 07/18/16  1914  First Provider Contact:  First MD Initiated Contact with Patient 07/18/16 1936        History   Chief Complaint No chief complaint on file.   HPI Sara Sanchez is a 25 y.o. female.   Urinary Frequency  This is a new problem. The current episode started yesterday. The problem has not changed since onset.Pertinent negatives include no abdominal pain.    Past Medical History:  Diagnosis Date  . Strep throat     There are no active problems to display for this patient.   Past Surgical History:  Procedure Laterality Date  . WISDOM TOOTH EXTRACTION  2012    OB History    No data available       Home Medications    Prior to Admission medications   Medication Sig Start Date End Date Taking? Authorizing Provider  amoxicillin (AMOXIL) 500 MG capsule Take 1 capsule (500 mg total) by mouth 3 (three) times daily. 03/31/16   Charm RingsErin J Honig, MD    Family History No family history on file.  Social History Social History  Substance Use Topics  . Smoking status: Never Smoker  . Smokeless tobacco: Not on file  . Alcohol use Yes     Comment: occasional     Allergies   Review of patient's allergies indicates no known allergies.   Review of Systems Review of Systems  Constitutional: Negative.  Negative for fever.  Gastrointestinal: Positive for nausea and vomiting. Negative for abdominal pain.  Genitourinary: Positive for dysuria, frequency and urgency. Negative for menstrual problem, pelvic pain, vaginal bleeding, vaginal discharge and vaginal pain.  All other systems reviewed and are negative.    Physical Exam Triage Vital Signs ED Triage Vitals [07/18/16 1931]  Enc Vitals Group     BP 124/87     Pulse Rate 74     Resp 12     Temp 99.4 F (37.4 C)     Temp Source Oral     SpO2 99 %     Weight      Height      Head Circumference      Peak Flow      Pain Score      Pain Loc       Pain Edu?      Excl. in GC?    No data found.   Updated Vital Signs BP 124/87 (BP Location: Left Arm)   Pulse 74   Temp 99.4 F (37.4 C) (Oral)   Resp 12   SpO2 99%   Visual Acuity Right Eye Distance:   Left Eye Distance:   Bilateral Distance:    Right Eye Near:   Left Eye Near:    Bilateral Near:     Physical Exam  Constitutional: She is oriented to person, place, and time. She appears well-developed and well-nourished. No distress.  Neck: Normal range of motion. Neck supple.  Cardiovascular: Normal rate, regular rhythm and normal heart sounds.   Pulmonary/Chest: Effort normal and breath sounds normal.  Abdominal: Soft. Bowel sounds are normal. There is no tenderness.  Lymphadenopathy:    She has no cervical adenopathy.  Neurological: She is alert and oriented to person, place, and time.  Skin: Skin is warm and dry.  Nursing note and vitals reviewed.    UC Treatments / Results  Labs (all labs ordered are listed, but only abnormal results are displayed)  Labs Reviewed  POCT URINALYSIS DIP (DEVICE) - Abnormal; Notable for the following:       Result Value   Ketones, ur 40 (*)    Hgb urine dipstick MODERATE (*)    pH 8.5 (*)    Protein, ur 100 (*)    Leukocytes, UA TRACE (*)    All other components within normal limits    EKG  EKG Interpretation None       Radiology No results found.  Procedures Procedures (including critical care time)  Medications Ordered in UC Medications - No data to display   Initial Impression / Assessment and Plan / UC Course  I have reviewed the triage vital signs and the nursing notes.  Pertinent labs & imaging results that were available during my care of the patient were reviewed by me and considered in my medical decision making (see chart for details).  Clinical Course      Final Clinical Impressions(s) / UC Diagnoses   Final diagnoses:  None    New Prescriptions New Prescriptions   No medications  on file     Linna Hoff, MD 07/18/16 1946

## 2016-07-18 NOTE — ED Triage Notes (Signed)
Pt     Reports   Symptoms      Of   Burning       On  Urination          With  Some  Low  abd  Pain            Pt reports
